# Patient Record
Sex: Male | Born: 1995 | Hispanic: No | Marital: Single | Smoking: Current every day smoker
Health system: Southern US, Community
[De-identification: ages and names within clinical notes are randomized; demographics above are authoritative.]

## PROBLEM LIST (undated history)

## (undated) DIAGNOSIS — K219 Gastro-esophageal reflux disease without esophagitis: Secondary | ICD-10-CM

## (undated) DIAGNOSIS — W3400XA Accidental discharge from unspecified firearms or gun, initial encounter: Secondary | ICD-10-CM

## (undated) DIAGNOSIS — K56609 Unspecified intestinal obstruction, unspecified as to partial versus complete obstruction: Secondary | ICD-10-CM

## (undated) DIAGNOSIS — Z8489 Family history of other specified conditions: Secondary | ICD-10-CM

---
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
## 2015-04-18 ENCOUNTER — Inpatient Hospital Stay (HOSPITAL_COMMUNITY)
Admission: EM | Admit: 2015-04-18 | Discharge: 2015-04-22 | DRG: 390 | Disposition: A | Discharge status: Home / Self Care | Payer: 59 | Attending: General Surgery | Admitting: General Surgery

## 2015-04-18 ENCOUNTER — Emergency Department (HOSPITAL_COMMUNITY): Payer: 59

## 2015-04-18 ENCOUNTER — Inpatient Hospital Stay (HOSPITAL_COMMUNITY): Payer: 59

## 2015-04-18 ENCOUNTER — Encounter (HOSPITAL_COMMUNITY): Payer: Self-pay | Admitting: Radiology

## 2015-04-18 DIAGNOSIS — Z4659 Encounter for fitting and adjustment of other gastrointestinal appliance and device: Secondary | ICD-10-CM

## 2015-04-18 DIAGNOSIS — K565 Intestinal adhesions [bands] with obstruction (postprocedural) (postinfection): Principal | ICD-10-CM | POA: Diagnosis present

## 2015-04-18 DIAGNOSIS — R52 Pain, unspecified: Secondary | ICD-10-CM | POA: Diagnosis present

## 2015-04-18 DIAGNOSIS — F17210 Nicotine dependence, cigarettes, uncomplicated: Secondary | ICD-10-CM | POA: Diagnosis present

## 2015-04-18 DIAGNOSIS — K56609 Unspecified intestinal obstruction, unspecified as to partial versus complete obstruction: Secondary | ICD-10-CM

## 2015-04-18 HISTORY — DX: Accidental discharge from unspecified firearms or gun, initial encounter: W34.00XA

## 2015-04-18 HISTORY — DX: Gastro-esophageal reflux disease without esophagitis: K21.9

## 2015-04-18 HISTORY — DX: Family history of other specified conditions: Z84.89

## 2015-04-18 HISTORY — DX: Unspecified intestinal obstruction, unspecified as to partial versus complete obstruction: K56.609

## 2015-04-18 LAB — CBC
HEMATOCRIT: 44.4 % (ref 39.0–52.0)
HEMATOCRIT: 38 % — AB (ref 39.0–52.0)
HEMOGLOBIN: 15 g/dL (ref 13.0–17.0)
HEMOGLOBIN: 13.2 g/dL (ref 13.0–17.0)
MCH: 30 pg (ref 26.0–34.0)
MCH: 30.4 pg (ref 26.0–34.0)
MCHC: 33.8 g/dL (ref 30.0–36.0)
MCHC: 34.7 g/dL (ref 30.0–36.0)
MCV: 88.8 fL (ref 78.0–100.0)
MCV: 87.6 fL (ref 78.0–100.0)
PLATELETS: 207 10*3/uL (ref 150–400)
PLATELETS: 181 10*3/uL (ref 150–400)
RBC: 5 MIL/uL (ref 4.22–5.81)
RBC: 4.34 MIL/uL (ref 4.22–5.81)
RDW: 12.8 % (ref 11.5–15.5)
RDW: 12.5 % (ref 11.5–15.5)
WBC: 7.9 10*3/uL (ref 4.0–10.5)
WBC: 9.5 10*3/uL (ref 4.0–10.5)

## 2015-04-18 LAB — TYPE AND SCREEN
ABO/RH(D): O NEG
ANTIBODY SCREEN: POSITIVE
DAT, IGG: POSITIVE
PT AG Type: NEGATIVE

## 2015-04-18 LAB — COMPREHENSIVE METABOLIC PANEL
ALK PHOS: 90 U/L (ref 38–126)
ALT: 19 U/L (ref 17–63)
AST: 36 U/L (ref 15–41)
Albumin: 4.9 g/dL (ref 3.5–5.0)
Anion gap: 13 (ref 5–15)
BILIRUBIN TOTAL: 1 mg/dL (ref 0.3–1.2)
BUN: 8 mg/dL (ref 6–20)
CHLORIDE: 107 mmol/L (ref 101–111)
CO2: 25 mmol/L (ref 22–32)
Calcium: 10.2 mg/dL (ref 8.9–10.3)
Creatinine, Ser: 1.1 mg/dL (ref 0.61–1.24)
GLUCOSE: 117 mg/dL — AB (ref 65–99)
Potassium: 3.8 mmol/L (ref 3.5–5.1)
Sodium: 145 mmol/L (ref 135–145)
Total Protein: 7.6 g/dL (ref 6.5–8.1)

## 2015-04-18 LAB — LIPASE, BLOOD: Lipase: 25 U/L (ref 11–51)

## 2015-04-18 LAB — I-STAT CG4 LACTIC ACID, ED
LACTIC ACID, VENOUS: 2.39 mmol/L — AB (ref 0.5–2.0)
Lactic Acid, Venous: 3.97 mmol/L (ref 0.5–2.0)

## 2015-04-18 LAB — CREATININE, SERUM
CREATININE: 1 mg/dL (ref 0.61–1.24)
GFR calc Af Amer: >60 mL/min (ref >60)

## 2015-04-18 LAB — ABO/RH: ABO/RH(D): O NEG

## 2015-04-18 MED ORDER — FENTANYL CITRATE (PF) 100 MCG/2ML IJ SOLN
100.0000 ug | Freq: Once | INTRAMUSCULAR | Status: AC
Start: 2015-04-18 — End: 2015-04-18
  Administered 2015-04-18: 100 ug via INTRAVENOUS
  Filled 2015-04-18: qty 2

## 2015-04-18 MED ORDER — PANTOPRAZOLE SODIUM 40 MG IV SOLR
40.0000 mg | Freq: Two times a day (BID) | INTRAVENOUS | Status: DC
  Administered 2015-04-18 – 2015-04-20 (×5): 40 mg via INTRAVENOUS
  Filled 2015-04-18 (×6): qty 40

## 2015-04-18 MED ORDER — FENTANYL CITRATE (PF) 100 MCG/2ML IJ SOLN
100.0000 ug | Freq: Once | INTRAMUSCULAR | Status: AC
  Administered 2015-04-18: 100 ug via INTRAVENOUS
  Filled 2015-04-18: qty 2

## 2015-04-18 MED ORDER — PIPERACILLIN-TAZOBACTAM 3.375 G IVPB 30 MIN
3.3750 g | Freq: Once | INTRAVENOUS | Status: AC
  Administered 2015-04-18: 3.375 g via INTRAVENOUS
  Filled 2015-04-18: qty 50

## 2015-04-18 MED ORDER — PHENOL 1.4 % MT LIQD
1.0000 | OROMUCOSAL | Status: DC | PRN
  Filled 2015-04-18: qty 177

## 2015-04-18 MED ORDER — ENOXAPARIN SODIUM 40 MG/0.4ML ~~LOC~~ SOLN
40.0000 mg | SUBCUTANEOUS | Status: DC
  Administered 2015-04-18 – 2015-04-21 (×4): 40 mg via SUBCUTANEOUS
  Filled 2015-04-18 (×5): qty 0.4

## 2015-04-18 MED ORDER — ACETAMINOPHEN 650 MG RE SUPP: 650.0000 mg | Freq: Four times a day (QID) | RECTAL | Status: DC | PRN

## 2015-04-18 MED ORDER — LIDOCAINE VISCOUS 2 % MT SOLN
15.0000 mL | Freq: Once | OROMUCOSAL | Status: AC
  Administered 2015-04-18: 15 mL via OROMUCOSAL
  Filled 2015-04-18: qty 15

## 2015-04-18 MED ORDER — KCL IN DEXTROSE-NACL 20-5-0.45 MEQ/L-%-% IV SOLN
INTRAVENOUS | Status: DC
  Administered 2015-04-18 – 2015-04-20 (×5): via INTRAVENOUS
  Filled 2015-04-18 (×6): qty 1000

## 2015-04-18 MED ORDER — INFLUENZA VAC SPLIT QUAD 0.5 ML IM SUSY
0.5000 mL | PREFILLED_SYRINGE | INTRAMUSCULAR | Status: AC
  Administered 2015-04-19: 0.5 mL via INTRAMUSCULAR
  Filled 2015-04-18: qty 0.5

## 2015-04-18 MED ORDER — DIPHENHYDRAMINE HCL 50 MG/ML IJ SOLN
25.0000 mg | Freq: Four times a day (QID) | INTRAMUSCULAR | Status: DC | PRN
  Administered 2015-04-19 – 2015-04-20 (×3): 25 mg via INTRAVENOUS
  Filled 2015-04-18 (×3): qty 1

## 2015-04-18 MED ORDER — ONDANSETRON HCL 4 MG/2ML IJ SOLN
4.0000 mg | Freq: Once | INTRAMUSCULAR | Status: AC
  Administered 2015-04-18: 4 mg via INTRAVENOUS
  Filled 2015-04-18: qty 2

## 2015-04-18 MED ORDER — ACETAMINOPHEN 325 MG PO TABS: 650.0000 mg | ORAL_TABLET | Freq: Four times a day (QID) | ORAL | Status: DC | PRN

## 2015-04-18 MED ORDER — ONDANSETRON 4 MG PO TBDP: 4.0000 mg | ORAL_TABLET | Freq: Four times a day (QID) | ORAL | Status: DC | PRN

## 2015-04-18 MED ORDER — IOHEXOL 300 MG/ML  SOLN
100.0000 mL | Freq: Once | INTRAMUSCULAR | Status: AC | PRN
  Administered 2015-04-18: 100 mL via INTRAVENOUS

## 2015-04-18 MED ORDER — SODIUM CHLORIDE 0.9 % IV BOLUS (SEPSIS)
1000.0000 mL | Freq: Once | INTRAVENOUS | Status: AC
  Administered 2015-04-18: 1000 mL via INTRAVENOUS

## 2015-04-18 MED ORDER — DIATRIZOATE MEGLUMINE & SODIUM 66-10 % PO SOLN
ORAL | Status: AC
  Filled 2015-04-18: qty 90

## 2015-04-18 MED ORDER — NICOTINE 14 MG/24HR TD PT24
14.0000 mg | MEDICATED_PATCH | Freq: Every day | TRANSDERMAL | Status: DC
  Administered 2015-04-19 – 2015-04-22 (×4): 14 mg via TRANSDERMAL
  Filled 2015-04-18 (×4): qty 1

## 2015-04-18 MED ORDER — DIATRIZOATE MEGLUMINE & SODIUM 66-10 % PO SOLN
90.0000 mL | Freq: Once | ORAL | Status: AC
  Administered 2015-04-18: 90 mL via NASOGASTRIC

## 2015-04-18 MED ORDER — ONDANSETRON HCL 4 MG/2ML IJ SOLN
4.0000 mg | Freq: Four times a day (QID) | INTRAMUSCULAR | Status: DC | PRN
  Administered 2015-04-18: 4 mg via INTRAVENOUS
  Filled 2015-04-18: qty 2

## 2015-04-18 MED ORDER — MORPHINE SULFATE (PF) 2 MG/ML IV SOLN
1.0000 mg | INTRAVENOUS | Status: DC | PRN
  Administered 2015-04-18: 2 mg via INTRAVENOUS
  Filled 2015-04-18: qty 1

## 2015-04-18 MED ORDER — HYDROMORPHONE HCL 1 MG/ML IJ SOLN
0.5000 mg | INTRAMUSCULAR | Status: DC | PRN
  Administered 2015-04-18 – 2015-04-19 (×7): 1 mg via INTRAVENOUS
  Administered 2015-04-19: 2 mg via INTRAVENOUS
  Administered 2015-04-19 – 2015-04-20 (×2): 1 mg via INTRAVENOUS
  Filled 2015-04-18 (×12): qty 1

## 2015-04-18 MED ORDER — PANTOPRAZOLE SODIUM 40 MG IV SOLR: 40.0000 mg | Freq: Every day | INTRAVENOUS | Status: DC

## 2015-04-18 MED ORDER — DIPHENHYDRAMINE HCL 25 MG PO CAPS: 25.0000 mg | ORAL_CAPSULE | Freq: Four times a day (QID) | ORAL | Status: DC | PRN
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
## 2015-04-18 NOTE — ED Notes (Signed)
General Surgery in with pt discussing plan of care

## 2015-04-18 NOTE — ED Notes (Signed)
Brien Matesontacted Rees, MD for pts symptoms, MD at bedside
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
## 2015-04-18 NOTE — Progress Notes (Signed)
Gastrografen administered. Tube clamped. Radiology notified of time.

## 2015-04-18 NOTE — ED Notes (Signed)
Attempted report 

## 2015-04-18 NOTE — ED Notes (Signed)
Pt in from home c/o acute onset abd pain today @ 9:00, pt reports x 15 vomiting episodes today, denies hematemesis, pt denies diarrhea, denies diarrhea, last BM this morning, pt diaphoretic upon arrival to ED, pt A&O x4, follows commands, speaks in complete sentences

## 2015-04-19 ENCOUNTER — Inpatient Hospital Stay (HOSPITAL_COMMUNITY): Payer: 59
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
## 2015-04-20 ENCOUNTER — Inpatient Hospital Stay (HOSPITAL_COMMUNITY): Payer: 59

## 2015-04-20 LAB — BASIC METABOLIC PANEL
Anion gap: 9 (ref 5–15)
BUN: <5 mg/dL — ABNORMAL LOW (ref 6–20)
CALCIUM: 8.8 mg/dL — AB (ref 8.9–10.3)
CO2: 27 mmol/L (ref 22–32)
CREATININE: 0.87 mg/dL (ref 0.61–1.24)
Chloride: 102 mmol/L (ref 101–111)
GFR calc non Af Amer: >60 mL/min (ref >60)
Glucose, Bld: 113 mg/dL — ABNORMAL HIGH (ref 65–99)
Potassium: 3.6 mmol/L (ref 3.5–5.1)
SODIUM: 138 mmol/L (ref 135–145)

## 2015-04-20 LAB — CBC
HCT: 39.3 % (ref 39.0–52.0)
Hemoglobin: 13.4 g/dL (ref 13.0–17.0)
MCH: 30.7 pg (ref 26.0–34.0)
MCHC: 34.1 g/dL (ref 30.0–36.0)
MCV: 90.1 fL (ref 78.0–100.0)
Platelets: 168 10*3/uL (ref 150–400)
RBC: 4.36 MIL/uL (ref 4.22–5.81)
RDW: 12.7 % (ref 11.5–15.5)
WBC: 4.9 10*3/uL (ref 4.0–10.5)

## 2015-04-20 NOTE — Progress Notes (Signed)
Paged MD as requested by pt.Pt asked me to notify MD that he felt well enough to go home. MD called back and explained that pt had to have a bowel movement before he could be discharged. Explained the above to pt and his family. Pt 's mom then asked to speak to the MD herself. MD paged and provided with pt's mom phone number
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
## 2015-04-21 ENCOUNTER — Inpatient Hospital Stay (HOSPITAL_COMMUNITY): Payer: 59
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
## 2015-04-21 NOTE — Progress Notes (Signed)
NGT discontinued as ordered. Pt ambulatory on unit independently with no concerns voiced. Refused lovenox shot this pm, education on importance of lovenox provided with understanding verbalized

## 2015-04-21 NOTE — Progress Notes (Signed)
Pt changed his mind and decided to have the lovenox shot, medication administered as charted
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
## 2015-04-22 ENCOUNTER — Inpatient Hospital Stay (HOSPITAL_COMMUNITY): Payer: 59

## 2015-04-22 LAB — CBC
HCT: 39.9 % (ref 39.0–52.0)
HEMOGLOBIN: 13.7 g/dL (ref 13.0–17.0)
MCH: 30.4 pg (ref 26.0–34.0)
MCHC: 34.3 g/dL (ref 30.0–36.0)
MCV: 88.7 fL (ref 78.0–100.0)
PLATELETS: 179 10*3/uL (ref 150–400)
RBC: 4.5 MIL/uL (ref 4.22–5.81)
RDW: 12.6 % (ref 11.5–15.5)
WBC: 4.2 10*3/uL (ref 4.0–10.5)

## 2015-04-22 LAB — BASIC METABOLIC PANEL
ANION GAP: 8 (ref 5–15)
BUN: 7 mg/dL (ref 6–20)
CALCIUM: 9.5 mg/dL (ref 8.9–10.3)
CHLORIDE: 104 mmol/L (ref 101–111)
CO2: 28 mmol/L (ref 22–32)
CREATININE: 0.94 mg/dL (ref 0.61–1.24)
GFR calc non Af Amer: >60 mL/min (ref >60)
Glucose, Bld: 92 mg/dL (ref 65–99)
Potassium: 3.7 mmol/L (ref 3.5–5.1)
SODIUM: 140 mmol/L (ref 135–145)

## 2015-04-22 MED ORDER — NICOTINE 14 MG/24HR TD PT24: 14.0000 mg | MEDICATED_PATCH | Freq: Every day | TRANSDERMAL | Status: AC

## 2015-04-22 NOTE — Discharge Instructions (Signed)
Small Bowel Obstruction °A small bowel obstruction is a blockage in the small bowel. The small bowel, which is also called the small intestine, is a long, slender tube that connects the stomach to the colon. When a person eats and drinks, food and fluids go from the stomach to the small bowel. This is where most of the nutrients in the food and fluids are absorbed. °A small bowel obstruction will prevent food and fluids from passing through the small bowel as they normally do during digestion. The small bowel can become partially or completely blocked. This can cause symptoms such as abdominal pain, vomiting, and bloating. If this condition is not treated, it can be dangerous because the small bowel could rupture. °CAUSES °Common causes of this condition include: °· Scar tissue from previous surgery or radiation treatment. °· Recent surgery. This may cause the movements of the bowel to slow down and cause food to block the intestine. °· Hernias. °· Inflammatory bowel disease (colitis). °· Twisting of the bowel (volvulus). °· Tumors. °· A foreign body. °· Slipping of a part of the bowel into another part (intussusception). °SYMPTOMS °Symptoms of this condition include: °· Abdominal pain. This may be dull cramps or sharp pain. It may occur in one area, or it may be present in the entire abdomen. Pain can range from mild to severe, depending on the degree of obstruction. °· Nausea and vomiting. Vomit may be greenish or a yellow bile color. °· Abdominal bloating. °· Constipation. °· Lack of passing gas. °· Frequent belching. °· Diarrhea. This may occur if the obstruction is partial and runny stool is able to leak around the obstruction. °DIAGNOSIS °This condition may be diagnosed based on a physical exam, medical history, and X-rays of the abdomen. You may also have other tests, such as a CT scan of the abdomen and pelvis. °TREATMENT °Treatment for this condition depends on the cause and severity of the problem.  Treatment options may include: °· Bed rest along with fluids and pain medicines that are given through an IV tube inserted into one of your veins. Sometimes, this is all that is needed for the obstruction to improve. °· Following a simple diet. In some cases, a clear liquid diet may be required for several days. This allows the bowel to rest. °· Placement of a small tube (nasogastric tube) into the stomach. When the bowel is blocked, it usually swells up like a balloon that is filled with air and fluids. The air and fluids may be removed by suction through the nasogastric tube. This can help with pain, discomfort, and nausea. It can also help the obstruction to clear up faster. °· Surgery. This may be required if other treatments do not work. Bowel obstruction from a hernia may require early surgery and can be an emergency procedure. Surgery may also be required for scar tissue that causes frequent or severe obstructions. °HOME CARE INSTRUCTIONS °· Get plenty of rest. °· Follow instructions from your health care provider about eating restrictions. You may need to avoid solid foods and consume only clear liquids until your condition improves. °· Take over-the-counter and prescription medicines only as told by your health care provider. °· Keep all follow-up visits as told by your health care provider. This is important. °SEEK MEDICAL CARE IF: °· You have a fever. °· You have chills. °SEEK IMMEDIATE MEDICAL CARE IF: °· You have increased pain or cramping. °· You vomit blood. °· You have uncontrolled vomiting or nausea. °· You cannot drink   fluids because of vomiting or pain.  You develop confusion.  You begin feeling very dry or thirsty (dehydrated).  You have severe bloating.  You feel extremely weak or you faint.   This information is not intended to replace advice given to you by your health care provider. Make sure you discuss any questions you have with your health care provider.   Document Released:  08/25/2005 Document Revised: 02/27/2015 Document Reviewed: 08/02/2014 Elsevier Interactive Patient Education 2016 Elsevier Inc.  Docusate capsules What is this medicine? DOCUSATE (doc CUE sayt) is stool softener. It helps prevent constipation and straining or discomfort associated with hard or dry stools. This medicine may be used for other purposes; ask your health care provider or pharmacist if you have questions. What should I tell my health care provider before I take this medicine? They need to know if you have any of these conditions: -nausea or vomiting -severe constipation -stomach pain -sudden change in bowel habit lasting more than 2 weeks -an unusual or allergic reaction to docusate, other medicines, foods, dyes, or preservatives -pregnant or trying to get pregnant -breast-feeding How should I use this medicine? Take this medicine by mouth with a glass of water. Follow the directions on the label. Take your doses at regular intervals. Do not take your medicine more often than directed. Talk to your pediatrician regarding the use of this medicine in children. While this medicine may be prescribed for children as young as 2 years for selected conditions, precautions do apply. Overdosage: If you think you have taken too much of this medicine contact a poison control center or emergency room at once. NOTE: This medicine is only for you. Do not share this medicine with others. What if I miss a dose? If you miss a dose, take it as soon as you can. If it is almost time for your next dose, take only that dose. Do not take double or extra doses. What may interact with this medicine? -mineral oil This list may not describe all possible interactions. Give your health care provider a list of all the medicines, herbs, non-prescription drugs, or dietary supplements you use. Also tell them if you smoke, drink alcohol, or use illegal drugs. Some items may interact with your medicine. What  should I watch for while using this medicine? Do not use for more than one week without advice from your doctor or health care professional. If your constipation returns, check with your doctor or health care professional. Drink plenty of water while taking this medicine. Drinking water helps decrease constipation. Stop using this medicine and contact your doctor or health care professional if you experience any rectal bleeding or do not have a bowel movement after use. These could be signs of a more serious condition. What side effects may I notice from receiving this medicine? Side effects that you should report to your doctor or health care professional as soon as possible: -allergic reactions like skin rash, itching or hives, swelling of the face, lips, or tongue Side effects that usually do not require medical attention (report to your doctor or health care professional if they continue or are bothersome): -diarrhea -stomach cramps -throat irritation This list may not describe all possible side effects. Call your doctor for medical advice about side effects. You may report side effects to FDA at 1-800-FDA-1088. Where should I keep my medicine? Keep out of the reach of children. Store at room temperature between 15 and 30 degrees C (59 and 86 degrees F). Throw  away any unused medicine after the expiration date. NOTE: This sheet is a summary. It may not cover all possible information. If you have questions about this medicine, talk to your doctor, pharmacist, or health care provider.    2016, Elsevier/Gold Standard. (2007-09-29 15:56:49)

## 2015-04-22 NOTE — Progress Notes (Signed)
Patient placed on soft diet. Patient is currently tolerating diet. Will continue to monitor.

## 2015-04-22 NOTE — Progress Notes (Signed)
Discharge paperwork given.IV removed.No questions verbalized. Patient is ready for discharge.
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
## 2015-04-22 NOTE — Progress Notes (Signed)
Patiend had a bowel movement this morning.
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
## 2017-09-13 ENCOUNTER — Other Ambulatory Visit: Payer: Self-pay | Admitting: Advanced Practice Midwife

## 2017-09-13 ENCOUNTER — Other Ambulatory Visit: Payer: 59

## 2017-09-13 DIAGNOSIS — Z3480 Encounter for supervision of other normal pregnancy, unspecified trimester: Secondary | ICD-10-CM

## 2017-09-13 NOTE — Progress Notes (Signed)
[facility] pregnant. MOB has sickle cell trait. FOB desires testing to see if he has the trait as well.

## 2017-09-14 LAB — HEMOGLOBINOPATHY EVALUATION
HEMOGLOBIN F QUANTITATION: 0 % (ref 0.0–2.0)
HGB A: 97.7 % (ref 96.4–98.8)
HGB C: 0 %
HGB S: 0 %
HGB VARIANT: 0 %
Hemoglobin A2 Quantitation: 2.3 % (ref 1.8–3.2)
